# Patient Record
Sex: Male | Born: 1975 | Race: Black or African American | Hispanic: No | Marital: Single | Smoking: Current every day smoker
Health system: Southern US, Community
[De-identification: ages and names within clinical notes are randomized; demographics above are authoritative.]

---

## 2018-05-31 ENCOUNTER — Other Ambulatory Visit: Payer: Self-pay

## 2018-05-31 ENCOUNTER — Emergency Department: Payer: BLUE CROSS/BLUE SHIELD

## 2018-05-31 ENCOUNTER — Encounter: Payer: Self-pay | Admitting: Emergency Medicine

## 2018-05-31 ENCOUNTER — Emergency Department
Admission: EM | Admit: 2018-05-31 | Discharge: 2018-05-31 | Disposition: A | Discharge status: Home / Self Care | Payer: BLUE CROSS/BLUE SHIELD | Attending: Emergency Medicine | Admitting: Emergency Medicine

## 2018-05-31 DIAGNOSIS — F17200 Nicotine dependence, unspecified, uncomplicated: Secondary | ICD-10-CM | POA: Diagnosis not present

## 2018-05-31 DIAGNOSIS — R29898 Other symptoms and signs involving the musculoskeletal system: Secondary | ICD-10-CM

## 2018-05-31 DIAGNOSIS — M79674 Pain in right toe(s): Secondary | ICD-10-CM | POA: Diagnosis present

## 2018-05-31 MED ORDER — IBUPROFEN 600 MG PO TABS: 600.0000 mg | ORAL_TABLET | Freq: Three times a day (TID) | ORAL | 0 refills | Status: AC | PRN

## 2018-05-31 NOTE — Discharge Instructions (Signed)
Follow-up with Dr. Orland Jarredroxler if any continued problems.  Begin taking ibuprofen 600 mg every 8 hours with food as needed for pain and inflammation.  Ice and elevate as needed for swelling.  Wear postop shoe for support until you are able to walk without pain.

## 2018-05-31 NOTE — ED Triage Notes (Signed)
States he was running up the steps  Hit left foot  States he bent back his toes  Min swelling noted across toes

## 2018-05-31 NOTE — ED Provider Notes (Signed)
[facility] Emergency Department Provider Note   ____________________________________________   First MD Initiated Contact with Patient 05/31/18 1324     (approximate)  I have reviewed the triage vital signs and the nursing notes.   HISTORY  Chief Complaint Foot Pain   HPI Jeffery French is a 42 y.o. male is here with complaint of pain to his left great toe.  Patient states he was running and missed the third step causing his toe to bend forward.  Patient states that last evening he left his shoe on until bedtime which time he was not having a great deal pain.  He states that it was not until this morning that he noticed swelling and was unable to bear weight due to pain.  Patient has not taken any over-the-counter medication.  He rates his pain as an 8 out of 10.  History reviewed. No pertinent past medical history.  There are no active problems to display for this patient.   History reviewed. No pertinent surgical history.  Prior to Admission medications   Medication Sig Start Date End Date Taking? Authorizing Provider  ibuprofen (ADVIL,MOTRIN) 600 MG tablet Take 1 tablet (600 mg total) by mouth every 8 (eight) hours as needed. 05/31/18   Tommi Rumps, PA-C    Allergies Patient has no known allergies.  No family history on file.  Social History Social History   Tobacco Use  . Smoking status: Current Every Day Smoker  . Smokeless tobacco: Never Used  Substance Use Topics  . Alcohol use: Not on file  . Drug use: Not on file    Review of Systems Constitutional: No fever/chills Cardiovascular: Denies chest pain. Respiratory: Denies shortness of breath. Musculoskeletal: Positive for left foot pain. Skin: Negative for injury. Neurological: Negative for  focal weakness or numbness. ___________________________________________   PHYSICAL EXAM:  VITAL SIGNS: ED Triage Vitals  Enc Vitals Group     BP 05/31/18 1315 (!) 154/87     Pulse  Rate 05/31/18 1315 (!) 56     Resp 05/31/18 1315 16     Temp 05/31/18 1315 98.4 F (36.9 C)     Temp Source 05/31/18 1315 Oral     SpO2 05/31/18 1315 98 %     Weight 05/31/18 1241 156 lb (70.8 kg)     Height 05/31/18 1241 5\' 5"  (1.651 m)     Head Circumference --      Peak Flow --      Pain Score 05/31/18 1241 8     Pain Loc --      Pain Edu? --      Excl. in GC? --    Constitutional: Alert and oriented. Well appearing and in no acute distress. Eyes: Conjunctivae are normal.  Head: Atraumatic. Neck: No stridor.   Cardiovascular: Normal rate, regular rhythm. Grossly normal heart sounds.  Good peripheral circulation. Respiratory: Normal respiratory effort.  No retractions. Lungs CTAB. Musculoskeletal: Examination of left foot there is moderate tenderness and soft tissue swelling noted to the first digit.  There is no gross deformity.  Range of motion is restricted secondary to discomfort.  Motor and sensory function intact and capillary refill is less than 3 seconds.  Skin is intact. Neurologic:  Normal speech and language. No gross focal neurologic deficits are appreciated.  Skin:  Skin is warm, dry and intact.  Psychiatric: Mood and affect are normal. Speech and behavior are normal.  ____________________________________________   LABS (all labs ordered are listed, but only  abnormal results are displayed)  Labs Reviewed - No data to display  RADIOLOGY  ED MD interpretation:   Left foot exam is negative for fracture.  Official radiology report(s): Dg Foot Complete Left  Result Date: 05/31/2018 CLINICAL DATA:  Foot injury with pain. EXAM: LEFT FOOT - COMPLETE 3+ VIEW COMPARISON:  None. FINDINGS: There is no evidence of fracture or dislocation. There is no evidence of arthropathy or other focal bone abnormality. Soft tissues are unremarkable. IMPRESSION: Negative. Electronically Signed   By: Kennith CenterEric  Mansell M.D.   On: 05/31/2018 13:05     ____________________________________________   PROCEDURES  Procedure(s) performed: Postop shoe was applied.  Procedures  Critical Care performed: No  ____________________________________________   INITIAL IMPRESSION / ASSESSMENT AND PLAN / ED COURSE  As part of my medical decision making, I reviewed the following data within the electronic MEDICAL RECORD NUMBER Notes from prior ED visits and [place] Controlled Substance Database  Patient is encouraged to ice and elevate foot for pain and to reduce swelling.  He was placed in a postop shoe to be worn until he is able to walk without pain.  He was given a prescription for ibuprofen 600 mg 3 times daily with food.  He is to follow-up with Dr. Orland Jarredroxler who is on-call for podiatry if any continued problems.  ____________________________________________   FINAL CLINICAL IMPRESSION(S) / ED DIAGNOSES  Final diagnoses:  Hyperextension of great toe     ED Discharge Orders        Ordered    ibuprofen (ADVIL,MOTRIN) 600 MG tablet  Every 8 hours PRN     05/31/18 1418       Note:  This document was prepared using Dragon voice recognition software and may include unintentional dictation errors.    Tommi RumpsSummers, Oshay Stranahan L, PA-C 05/31/18 1427    Emily FilbertWilliams, Jonathan E, MD 05/31/18 [phone]

## 2019-05-28 IMAGING — DX DG FOOT COMPLETE 3+V*L*
3 series · 3 of 3 positions shown · non-contrast
Comparison: None.

CLINICAL DATA: Foot injury with pain.

EXAM:
LEFT FOOT - COMPLETE 3+ VIEW

[foot ap]
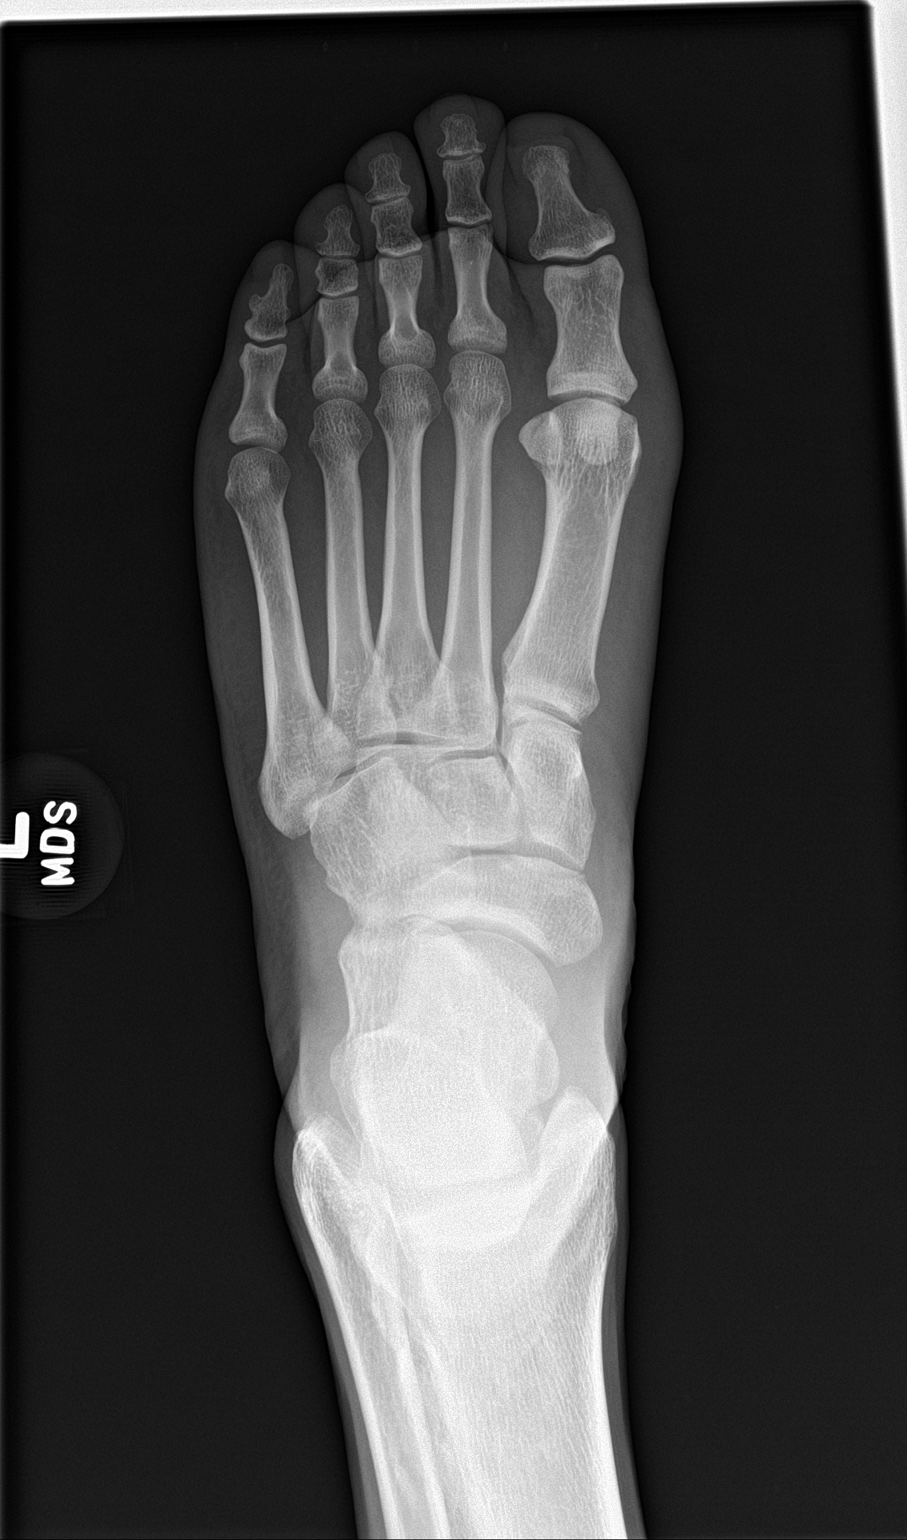

[foot obl]
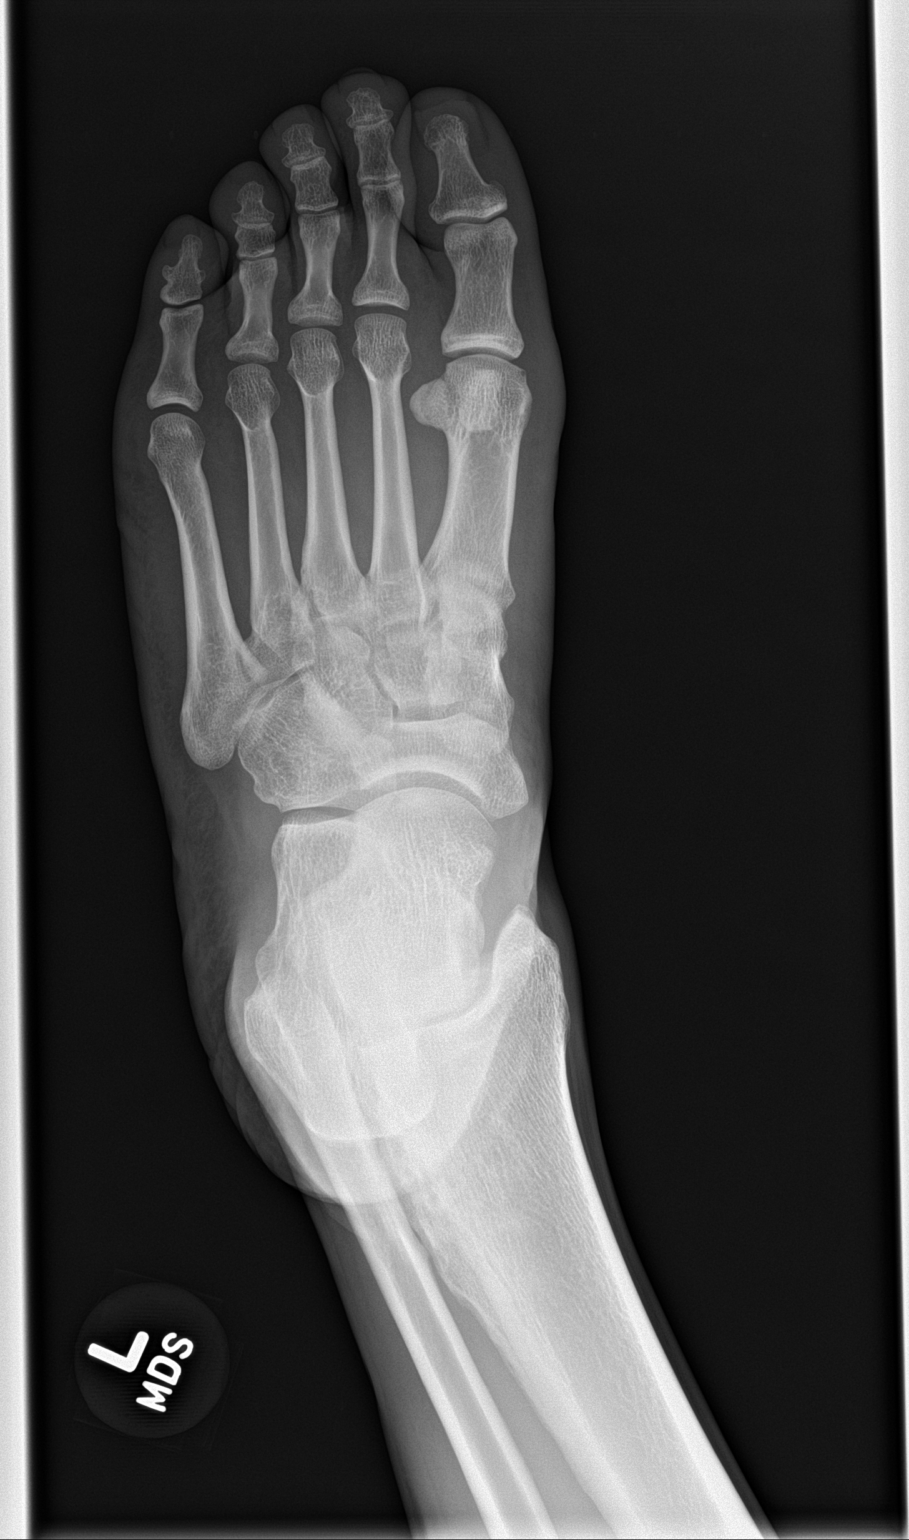

[foot lat]
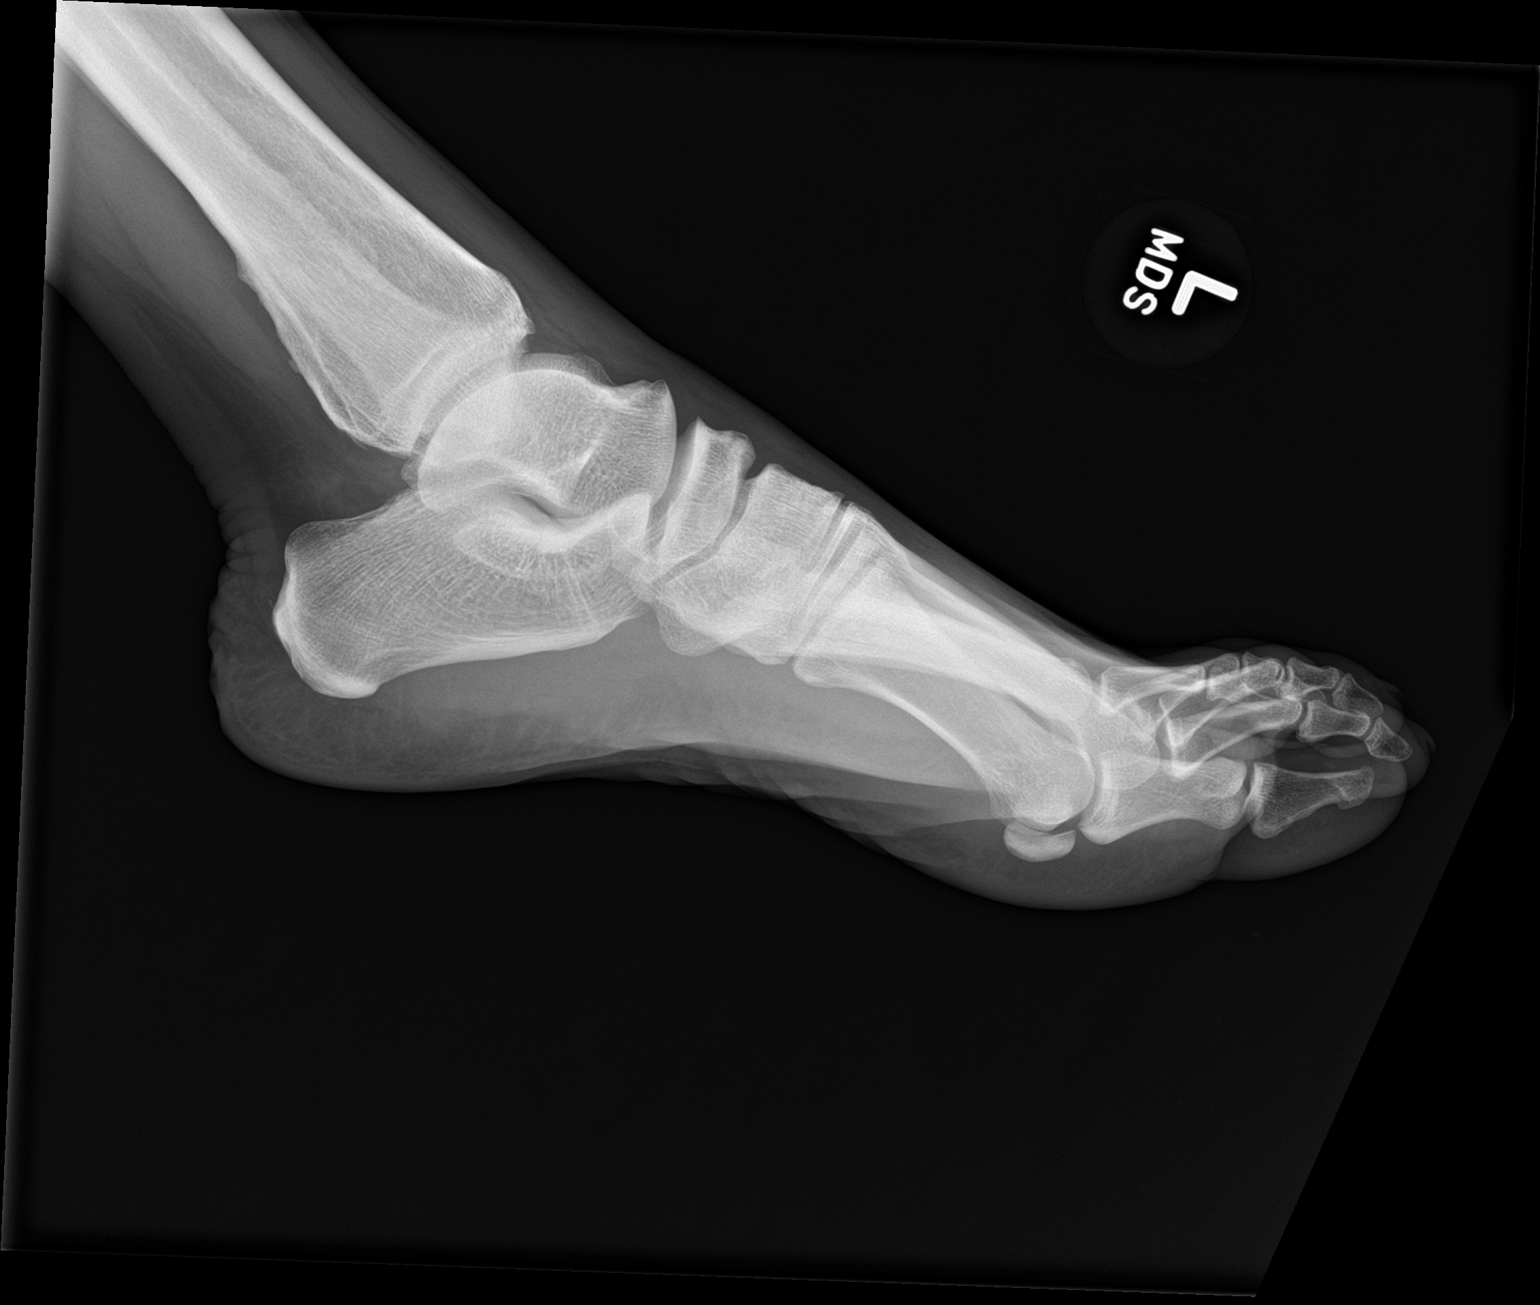

[3 of 3 positions shown; findings below may reference images not displayed]

FINDINGS: There is no evidence of fracture or dislocation. There is no
evidence of arthropathy or other focal bone abnormality. Soft
tissues are unremarkable.
IMPRESSION: Negative.
